# Patient Record
Sex: Male | Born: 1985 | Race: Black or African American | Hispanic: No | Marital: Single | Smoking: Current every day smoker
Health system: Southern US, Community
[De-identification: ages and names within clinical notes are randomized; demographics above are authoritative.]

---
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
## 2007-10-24 ENCOUNTER — Emergency Department (HOSPITAL_COMMUNITY): Admission: EM | Admit: 2007-10-24 | Discharge: 2007-10-24 | Payer: Self-pay | Admitting: Emergency Medicine

## 2009-04-20 ENCOUNTER — Emergency Department (HOSPITAL_COMMUNITY): Admission: EM | Admit: 2009-04-20 | Discharge: 2009-04-20 | Payer: Self-pay | Admitting: General Surgery

## 2009-04-22 ENCOUNTER — Emergency Department (HOSPITAL_COMMUNITY): Admission: EM | Admit: 2009-04-22 | Discharge: 2009-04-22 | Payer: Self-pay | Admitting: Family Medicine

## 2011-01-31 LAB — CBC
HCT: 43.4
Hemoglobin: 14.1
RBC: 5.46
RDW: 15.1
WBC: 18.7 — ABNORMAL HIGH

## 2011-01-31 LAB — COMPREHENSIVE METABOLIC PANEL
ALT: 34
Alkaline Phosphatase: 67
BUN: 21
CO2: 17 — ABNORMAL LOW
Chloride: 104
Glucose, Bld: 151 — ABNORMAL HIGH
Potassium: 5.1
Sodium: 138
Total Bilirubin: 1.4 — ABNORMAL HIGH
Total Protein: 7.5

## 2011-01-31 LAB — DIFFERENTIAL
Basophils Absolute: 0
Basophils Relative: 0
Eosinophils Absolute: 0
Monocytes Relative: 2 — ABNORMAL LOW
Neutro Abs: 17.2 — ABNORMAL HIGH
Neutrophils Relative %: 92 — ABNORMAL HIGH

## 2011-01-31 LAB — URINALYSIS, ROUTINE W REFLEX MICROSCOPIC
Glucose, UA: NEGATIVE
Hgb urine dipstick: NEGATIVE
Ketones, ur: >80 — AB
Protein, ur: NEGATIVE
Urobilinogen, UA: 0.2

## 2015-09-06 ENCOUNTER — Emergency Department (HOSPITAL_COMMUNITY)
Admission: EM | Admit: 2015-09-06 | Discharge: 2015-09-06 | Disposition: A | Discharge status: Home / Self Care | Payer: Self-pay | Attending: Emergency Medicine | Admitting: Emergency Medicine

## 2015-09-06 ENCOUNTER — Encounter (HOSPITAL_COMMUNITY): Payer: Self-pay | Admitting: Emergency Medicine

## 2015-09-06 DIAGNOSIS — F1210 Cannabis abuse, uncomplicated: Secondary | ICD-10-CM | POA: Insufficient documentation

## 2015-09-06 DIAGNOSIS — K029 Dental caries, unspecified: Secondary | ICD-10-CM

## 2015-09-06 DIAGNOSIS — F17200 Nicotine dependence, unspecified, uncomplicated: Secondary | ICD-10-CM | POA: Insufficient documentation

## 2015-09-06 MED ORDER — PENICILLIN V POTASSIUM 500 MG PO TABS: 500.0000 mg | ORAL_TABLET | Freq: Three times a day (TID) | ORAL | Status: AC

## 2015-09-06 MED ORDER — HYDROCODONE-ACETAMINOPHEN 5-325 MG PO TABS: 1.0000 | ORAL_TABLET | ORAL | Status: DC | PRN

## 2015-09-06 NOTE — ED Provider Notes (Signed)
CSN: [id number]     Arrival date & time 09/06/15  [id number] History   First MD Initiated Contact with Patient 09/06/15 [phone]     Chief Complaint  Patient presents with  . Dental Pain     (Consider location/radiation/quality/duration/timing/severity/associated sxs/prior Treatment) Patient is a 30 y.o. male presenting with tooth pain. The history is provided by the patient.  Dental Pain Location:  Upper Upper teeth location:  2/RU 2nd molar Quality:  Throbbing Severity:  Moderate Onset quality:  Sudden Duration:  2 days Timing:  Constant Progression:  Worsening Chronicity:  New Relieved by:  Nothing Worsened by:  Nothing tried   History reviewed. No pertinent past medical history. History reviewed. No pertinent past surgical history. Family History  Problem Relation Age of Onset  . Diabetes Other    Social History  Substance Use Topics  . Smoking status: Current Every Day Smoker  . Smokeless tobacco: None  . Alcohol Use: No    Review of Systems  All other systems reviewed and are negative.     Allergies  Review of patient's allergies indicates no known allergies.  Home Medications   Prior to Admission medications   Not on File   BP 132/89 mmHg  Pulse 60  Temp(Src) 98 F (36.7 C) (Oral)  Resp 18  SpO2 99% Physical Exam  Constitutional: He is oriented to person, place, and time. He appears well-developed and well-nourished. No distress.  HENT:  Head: Normocephalic and atraumatic.  The right upper second molar is noted to have significant decay.  There is some gingival inflammation but no purulent drainage or abscess.  Neck: Normal range of motion. Neck supple.  Musculoskeletal: Normal range of motion.  Neurological: He is alert and oriented to person, place, and time.  Skin: Skin is warm. He is not diaphoretic.  Nursing note and vitals reviewed.   ED Course  Procedures (including critical care time) Labs Review Labs Reviewed - No data to  display  Imaging Review No results found. I have personally reviewed and evaluated these images and lab results as part of my medical decision-making.   EKG Interpretation None      MDM   Final diagnoses:  None    Will treat with penicillin, pain meds, and follow-up with dentistry.    Geoffery Lyonsouglas Joleen Stuckert, MD 09/06/15 [phone]

## 2015-09-06 NOTE — ED Notes (Signed)
Pt is c/o toothache on the right top  Pt states sxs started on Monday

## 2015-09-06 NOTE — Discharge Instructions (Signed)
Penicillin as prescribed.  Hydrocodone as prescribed as needed for pain.  Follow-up with dentistry in the next 2-3 days.   Dental Caries Dental caries (also called tooth decay) is the most common oral disease. It can occur at any age but is more common in children and young adults.  HOW DENTAL CARIES DEVELOPS  The process of decay begins when bacteria and foods (particularly sugars and starches) combine in your mouth to produce plaque. Plaque is a substance that sticks to the hard, outer surface of a tooth (enamel). The bacteria in plaque produce acids that attack enamel. These acids may also attack the root surface of a tooth (cementum) if it is exposed. Repeated attacks dissolve these surfaces and create holes in the tooth (cavities). If left untreated, the acids destroy the other layers of the tooth.  RISK FACTORS  Frequent sipping of sugary beverages.   Frequent snacking on sugary and starchy foods, especially those that easily get stuck in the teeth.   Poor oral hygiene.   Dry mouth.   Substance abuse such as methamphetamine abuse.   Broken or poor-fitting dental restorations.   Eating disorders.   Gastroesophageal reflux disease (GERD).   Certain radiation treatments to the head and neck. SYMPTOMS In the early stages of dental caries, symptoms are seldom present. Sometimes white, chalky areas may be seen on the enamel or other tooth layers. In later stages, symptoms may include:  Pits and holes on the enamel.  Toothache after sweet, hot, or cold foods or drinks are consumed.  Pain around the tooth.  Swelling around the tooth. DIAGNOSIS  Most of the time, dental caries is detected during a regular dental checkup. A diagnosis is made after a thorough medical and dental history is taken and the surfaces of your teeth are checked for signs of dental caries. Sometimes special instruments, such as lasers, are used to check for dental caries. Dental X-ray exams may be  taken so that areas not visible to the eye (such as between the contact areas of the teeth) can be checked for cavities.  TREATMENT  If dental caries is in its early stages, it may be reversed with a fluoride treatment or an application of a remineralizing agent at the dental office. Thorough brushing and flossing at home is needed to aid these treatments. If it is in its later stages, treatment depends on the location and extent of tooth destruction:   If a small area of the tooth has been destroyed, the destroyed area will be removed and cavities will be filled with a material such as gold, silver amalgam, or composite resin.   If a large area of the tooth has been destroyed, the destroyed area will be removed and a cap (crown) will be fitted over the remaining tooth structure.   If the center part of the tooth (pulp) is affected, a procedure called a root canal will be needed before a filling or crown can be placed.   If most of the tooth has been destroyed, the tooth may need to be pulled (extracted). HOME CARE INSTRUCTIONS You can prevent, stop, or reverse dental caries at home by practicing good oral hygiene. Good oral hygiene includes:  Thoroughly cleaning your teeth at least twice a day with a toothbrush and dental floss.   Using a fluoride toothpaste. A fluoride mouth rinse may also be used if recommended by your dentist or health care provider.   Restricting the amount of sugary and starchy foods and sugary liquids  you consume.   Avoiding frequent snacking on these foods and sipping of these liquids.   Keeping regular visits with a dentist for checkups and cleanings. PREVENTION   Practice good oral hygiene.  Consider a dental sealant. A dental sealant is a coating material that is applied by your dentist to the pits and grooves of teeth. The sealant prevents food from being trapped in them. It may protect the teeth for several years.  Ask about fluoride supplements if  you live in a community without fluorinated water or with water that has a low fluoride content. Use fluoride supplements as directed by your dentist or health care provider.  Allow fluoride varnish applications to teeth if directed by your dentist or health care provider.   This information is not intended to replace advice given to you by your health care provider. Make sure you discuss any questions you have with your health care provider.   Document Released: 01/12/2002 Document Revised: 05/13/2014 Document Reviewed: 04/24/2012 Elsevier Interactive Patient Education 2016 [employer] Resource Guide Dental The United [employer] 211 is a great source of information about community services available.  Access by dialing 2-1-1 from anywhere in [place], or by website -  PooledIncome.plwww.nc211.org.   Other Local Resources (Updated 05/2015)  Dental  Care   Services    Phone Number and Address  Cost  [facility] For children 190 - 28 years of age:   Cleaning  Tooth brushing/flossing instruction  Sealants, fillings, crowns  Extractions  Emergency treatment  [phone] [address] [place], [place] [id number] Charges based on family income.  Medicaid and some insurance plans accepted.     [facility]  Cleaning  Sealants, fillings, crowns  Extractions  Emergency treatment [phone] [address] [place], [place]  Pregnant women 28 years of age or older with a Medicaid card  [facility] [place]  Cleaning  Sealants, fillings, crowns  Extractions  Emergency treatment [phone] [address] [place], [place] Pregnant women 28 years of age or older with a Medicaid card  [facility] For children 80 - 28 years of age:   Cleaning  Tooth brushing/flossing instruction  Sealants, fillings,  crowns  Extractions  Emergency treatment Limited orthodontic services for patients with Medicaid [phone] [address] [place], [place] [id number] Medicaid and [facility] Health Choice cover for children up to age 28 and pregnant women.  Parents of children up to age 28 without Medicaid pay a reduced fee at time of service.  [facility] [place] For children 490 - 28 years of age:   Cleaning  Tooth brushing/flossing instruction  Sealants, fillings, crowns  Extractions  Emergency treatment Limited orthodontic services for patients with Medicaid [id number] [address] [place], [place].  Medicaid and [facility] Health Choice cover for children up to age 28 and pregnant women.  Parents of children up to age 28 without Medicaid pay a reduced fee.  [facility]  Cleaning  Sealants, fillings, crowns  Extractions  Hours: Tuesdays and Thursdays, 4:15 - 8 pm [phone] [address] [place], [place] [id number] Services free of charge to [facility] residents ages 18-64 who do not have health insurance, Medicare, [place], or [place] benefits and fall within federal poverty guidelines  [facility]    Provides  dental care in addition to primary medical care, nutritional counseling, and pharmacy:  Cleaning  Sealants, fillings, crowns  Extractions                  [phone] [facility], [address] [place], [place]  [phone] [facility], [address] [place], [place]  [phone] [facility] [place], [place]  [phone] [facility], [address] [place], [place]  [phone] [facility] [address] [place], [place] Accepts [place], [place], most insurance.  Also provides services available to all with fees adjusted based on ability  to pay.    [facility]  Cleaning  Tooth brushing/flossing instruction  Sealants, fillings, crowns  Extractions  Emergency treatment Hours: Tuesdays, Thursdays, and Fridays from 8 am to 5 pm by appointment only. [phone] [address] [place], [place] [address] [facility] residents with Medicaid (depending on eligibility) and children with [facility] Health Choice - call for more information.  [facility]  Extractions only  Hours: 2nd and 4th Thursday of each month from 6:30 am - 9 am.   [phone] [address] [place], [place] [address] Ages 14 and older only.  Patients are seen on a first come, first served basis.  [employer]  [employer]  Extractions  Orthodontics  Endodontics  Implants/Crowns/Bridges  Complete and partial dentures [phone] [place], [place] Patients must complete an application for services.  There is often a waiting list.

## 2016-12-29 ENCOUNTER — Emergency Department (HOSPITAL_COMMUNITY)
Admission: EM | Admit: 2016-12-29 | Discharge: 2016-12-29 | Disposition: A | Discharge status: Home / Self Care | Payer: Self-pay | Attending: Emergency Medicine | Admitting: Emergency Medicine

## 2016-12-29 ENCOUNTER — Encounter (HOSPITAL_COMMUNITY): Payer: Self-pay | Admitting: Emergency Medicine

## 2016-12-29 DIAGNOSIS — R509 Fever, unspecified: Secondary | ICD-10-CM | POA: Insufficient documentation

## 2016-12-29 DIAGNOSIS — R61 Generalized hyperhidrosis: Secondary | ICD-10-CM | POA: Insufficient documentation

## 2016-12-29 DIAGNOSIS — F17200 Nicotine dependence, unspecified, uncomplicated: Secondary | ICD-10-CM | POA: Insufficient documentation

## 2016-12-29 DIAGNOSIS — R5383 Other fatigue: Secondary | ICD-10-CM | POA: Insufficient documentation

## 2016-12-29 DIAGNOSIS — R1310 Dysphagia, unspecified: Secondary | ICD-10-CM | POA: Insufficient documentation

## 2016-12-29 DIAGNOSIS — R112 Nausea with vomiting, unspecified: Secondary | ICD-10-CM | POA: Insufficient documentation

## 2016-12-29 DIAGNOSIS — K0889 Other specified disorders of teeth and supporting structures: Secondary | ICD-10-CM | POA: Insufficient documentation

## 2016-12-29 LAB — COMPREHENSIVE METABOLIC PANEL
ALBUMIN: 4.2 g/dL (ref 3.5–5.0)
ALT: 12 U/L — ABNORMAL LOW (ref 17–63)
ANION GAP: 11 (ref 5–15)
AST: 21 U/L (ref 15–41)
Alkaline Phosphatase: 39 U/L (ref 38–126)
BUN: 15 mg/dL (ref 6–20)
CHLORIDE: 103 mmol/L (ref 101–111)
CO2: 24 mmol/L (ref 22–32)
Calcium: 9.3 mg/dL (ref 8.9–10.3)
Creatinine, Ser: 1.74 mg/dL — ABNORMAL HIGH (ref 0.61–1.24)
GFR calc Af Amer: 60 mL/min — ABNORMAL LOW (ref >60)
GFR calc non Af Amer: 51 mL/min — ABNORMAL LOW (ref >60)
GLUCOSE: 131 mg/dL — AB (ref 65–99)
POTASSIUM: 3.9 mmol/L (ref 3.5–5.1)
SODIUM: 138 mmol/L (ref 135–145)
Total Bilirubin: 0.7 mg/dL (ref 0.3–1.2)
Total Protein: 7.1 g/dL (ref 6.5–8.1)

## 2016-12-29 LAB — CBC WITH DIFFERENTIAL/PLATELET
BASOS PCT: 0 %
Basophils Absolute: 0 10*3/uL (ref 0.0–0.1)
EOS ABS: 0 10*3/uL (ref 0.0–0.7)
Eosinophils Relative: 0 %
HCT: 38.9 % — ABNORMAL LOW (ref 39.0–52.0)
HEMOGLOBIN: 12.8 g/dL — AB (ref 13.0–17.0)
Lymphocytes Relative: 17 %
Lymphs Abs: 1.4 10*3/uL (ref 0.7–4.0)
MCH: 25.3 pg — ABNORMAL LOW (ref 26.0–34.0)
MCHC: 32.9 g/dL (ref 30.0–36.0)
MCV: 76.9 fL — ABNORMAL LOW (ref 78.0–100.0)
MONO ABS: 0.6 10*3/uL (ref 0.1–1.0)
MONOS PCT: 8 %
NEUTROS PCT: 75 %
Neutro Abs: 6 10*3/uL (ref 1.7–7.7)
PLATELETS: 140 10*3/uL — AB (ref 150–400)
RBC: 5.06 MIL/uL (ref 4.22–5.81)
RDW: 15 % (ref 11.5–15.5)
WBC: 8 10*3/uL (ref 4.0–10.5)

## 2016-12-29 LAB — LIPASE, BLOOD: Lipase: 22 U/L (ref 11–51)

## 2016-12-29 LAB — I-STAT CG4 LACTIC ACID, ED: Lactic Acid, Venous: 0.91 mmol/L (ref 0.5–1.9)

## 2016-12-29 MED ORDER — ONDANSETRON 4 MG PO TBDP
ORAL_TABLET | ORAL | Status: AC
  Filled 2016-12-29: qty 1

## 2016-12-29 MED ORDER — AMOXICILLIN 500 MG PO CAPS
1000.0000 mg | ORAL_CAPSULE | Freq: Once | ORAL | Status: AC
  Administered 2016-12-29: 1000 mg via ORAL
  Filled 2016-12-29: qty 2

## 2016-12-29 MED ORDER — SODIUM CHLORIDE 0.9 % IV BOLUS (SEPSIS)
1000.0000 mL | Freq: Once | INTRAVENOUS | Status: AC
  Administered 2016-12-29: 1000 mL via INTRAVENOUS

## 2016-12-29 MED ORDER — ONDANSETRON HCL 4 MG/2ML IJ SOLN
4.0000 mg | Freq: Once | INTRAMUSCULAR | Status: AC
  Administered 2016-12-29: 4 mg via INTRAVENOUS
  Filled 2016-12-29: qty 2

## 2016-12-29 MED ORDER — HYDROCODONE-ACETAMINOPHEN 5-325 MG PO TABS
ORAL_TABLET | ORAL | 0 refills | Status: AC
Start: 2016-12-29 — End: ?

## 2016-12-29 MED ORDER — ONDANSETRON 4 MG PO TBDP
4.0000 mg | ORAL_TABLET | Freq: Once | ORAL | Status: AC
  Administered 2016-12-29: 4 mg via ORAL

## 2016-12-29 MED ORDER — MORPHINE SULFATE (PF) 4 MG/ML IV SOLN
4.0000 mg | Freq: Once | INTRAVENOUS | Status: AC
  Administered 2016-12-29: 4 mg via INTRAVENOUS
  Filled 2016-12-29: qty 1

## 2016-12-29 MED ORDER — PROMETHAZINE HCL 25 MG RE SUPP: 25.0000 mg | Freq: Four times a day (QID) | RECTAL | 0 refills | Status: AC | PRN

## 2016-12-29 MED ORDER — AMOXICILLIN 500 MG PO CAPS: 1000.0000 mg | ORAL_CAPSULE | Freq: Two times a day (BID) | ORAL | 0 refills | Status: AC

## 2016-12-29 NOTE — Discharge Instructions (Signed)
Take vicodin for breakthrough pain, do not drink alcohol, drive, care for children or do other critical tasks while taking vicodin.  Take percocet for breakthrough pain, do not drink alcohol, drive, care for children or do other critical tasks while taking percocet.  Return to the emergency room for fever, change in vision, redness to the face that rapidly spreads towards the eye, nausea or vomiting, difficulty swallowing or shortness of breath.  Followup with a dentist is very important for ongoing evaluation and management of recurrent dental pain. Return to emergency department for emergent changing or worsening symptoms.  Low-cost dental clinic: [facility]  at [phone]**   You may also call [phone]

## 2016-12-29 NOTE — ED Provider Notes (Signed)
[facility] DEPT Provider Note   CSN: [id number] Arrival date & time: 12/29/16  0518     History   Chief Complaint Chief Complaint  Patient presents with  . Dental Pain    HPI   Blood pressure (!) 120/92, pulse (!) 49, temperature 98.2 F (36.8 C), temperature source Oral, resp. rate 18, height 5\' 9"  (1.753 m), weight 95.7 kg (211 lb), SpO2 100 %.  Jerry Bryant is a 31 y.o. male complaining of Left lower dental pain onset yesterday with swelling associated. His multiple episodes of nausea and vomiting this morning, he feels generally fatigued and is having difficulty swallowing. Reports tactile fever with no abdominal pain, change in bowel or bladder habits, lip or tongue swelling.  History reviewed. No pertinent past medical history.  There are no active problems to display for this patient.   History reviewed. No pertinent surgical history.     Home Medications    Prior to Admission medications   Medication Sig Start Date End Date Taking? Authorizing Provider  amoxicillin (AMOXIL) 500 MG capsule Take 2 capsules (1,000 mg total) by mouth 2 (two) times daily. 12/29/16   Reah Justo, Joni Reining, PA-C  HYDROcodone-acetaminophen (NORCO/VICODIN) 5-325 MG tablet Take 1-2 tablets by mouth every 6 hours as needed for pain. 12/29/16   Brigette Hopfer, Joni Reining, PA-C  penicillin v potassium (VEETID) 500 MG tablet Take 1 tablet (500 mg total) by mouth 3 (three) times daily. 09/06/15   Geoffery Lyons, MD  promethazine (PHENERGAN) 25 MG suppository Place 1 suppository (25 mg total) rectally every 6 (six) hours as needed for nausea or vomiting. 12/29/16   Shaunika Italiano, Joni Reining, PA-C    Family History Family History  Problem Relation Age of Onset  . Diabetes Other     Social History Social History  Substance Use Topics  . Smoking status: Current Every Day Smoker  . Smokeless tobacco: Never Used  . Alcohol use No     Allergies   Patient has no known allergies.   Review of Systems Review of  Systems  A complete review of systems was obtained and all systems are negative except as noted in the HPI and PMH.   Physical Exam Updated Vital Signs BP 109/67   Pulse (!) 50   Temp 98 F (36.7 C) (Oral)   Resp 18   Ht 5\' 9"  (1.753 m)   Wt 95.7 kg (211 lb)   SpO2 95%   BMI 31.16 kg/m   Physical Exam  Constitutional: He is oriented to person, place, and time. He appears well-developed and well-nourished. No distress.  Diaphoretic  HENT:  Head: Normocephalic and atraumatic.  Mouth/Throat: Oropharynx is clear and moist.  Generally poor dentition, no gingival swelling, erythema or tenderness to palpation. Patient is handling their secretions. There is no tenderness to palpation or firmness underneath tongue bilaterally. No trismus.    Eyes: Pupils are equal, round, and reactive to light. Conjunctivae and EOM are normal.  Neck: Normal range of motion.  Cardiovascular: Normal rate, regular rhythm and intact distal pulses.   Pulmonary/Chest: Effort normal and breath sounds normal. No stridor. No respiratory distress. He has no wheezes. He has no rales. He exhibits no tenderness.  Abdominal: Soft. He exhibits no distension and no mass. There is no tenderness. There is no rebound and no guarding. No hernia.  Musculoskeletal: Normal range of motion.  Lymphadenopathy:    He has no cervical adenopathy.  Neurological: He is alert and oriented to person, place, and time.  Skin: Capillary refill takes  less than 2 seconds. He is not diaphoretic.  Psychiatric: He has a normal mood and affect.  Nursing note and vitals reviewed.    ED Treatments / Results  Labs (all labs ordered are listed, but only abnormal results are displayed) Labs Reviewed  CBC WITH DIFFERENTIAL/PLATELET - Abnormal; Notable for the following:       Result Value   Hemoglobin 12.8 (*)    HCT 38.9 (*)    MCV 76.9 (*)    MCH 25.3 (*)    Platelets 140 (*)    All other components within normal limits  COMPREHENSIVE  METABOLIC PANEL - Abnormal; Notable for the following:    Glucose, Bld 131 (*)    Creatinine, Ser 1.74 (*)    ALT 12 (*)    GFR calc non Af Amer 51 (*)    GFR calc Af Amer 60 (*)    All other components within normal limits  LIPASE, BLOOD  I-STAT CG4 LACTIC ACID, ED    EKG  EKG Interpretation None       Radiology No results found.  Procedures Procedures (including critical care time)  Medications Ordered in ED Medications  ondansetron (ZOFRAN-ODT) disintegrating tablet 4 mg (4 mg Oral Given 12/29/16 0616)  sodium chloride 0.9 % bolus 1,000 mL (0 mLs Intravenous Stopped 12/29/16 0945)  morphine 4 MG/ML injection 4 mg (4 mg Intravenous Given 12/29/16 0754)  ondansetron (ZOFRAN) injection 4 mg (4 mg Intravenous Given 12/29/16 0754)  amoxicillin (AMOXIL) capsule 1,000 mg (1,000 mg Oral Given 12/29/16 0947)     Initial Impression / Assessment and Plan / ED Course  I have reviewed the triage vital signs and the nursing notes.  Pertinent labs & imaging results that were available during my care of the patient were reviewed by me and considered in my medical decision making (see chart for details).     Vitals:   12/29/16 0830 12/29/16 0900 12/29/16 0930 12/29/16 0945  BP: 110/68 107/67 109/67   Pulse: (!) 48 (!) 49 (!) 50   Resp:    18  Temp:    98 F (36.7 C)  TempSrc:    Oral  SpO2: 93% 95% 95%   Weight:      Height:        Medications  ondansetron (ZOFRAN-ODT) disintegrating tablet 4 mg (4 mg Oral Given 12/29/16 0616)  sodium chloride 0.9 % bolus 1,000 mL (0 mLs Intravenous Stopped 12/29/16 0945)  morphine 4 MG/ML injection 4 mg (4 mg Intravenous Given 12/29/16 0754)  ondansetron (ZOFRAN) injection 4 mg (4 mg Intravenous Given 12/29/16 0754)  amoxicillin (AMOXIL) capsule 1,000 mg (1,000 mg Oral Given 12/29/16 0947)    Jerry Bryant is 31 y.o. male presenting with Dental pain, no gross dental abscess. He is vomiting and appears diaphoretic on my exam, will check basic  blood work, hydrate.  Blood work reassuring. He is tolerating by mouth's and feels much better after hydration, repeat abdominal exam is benign. Patient will be started on amoxicillin for possible early abscess given pain medication and dental resource guide.  Evaluation does not show pathology that would require ongoing emergent intervention or inpatient treatment. Pt is hemodynamically stable and mentating appropriately. Discussed findings and plan with patient/guardian, who agrees with care plan. All questions answered. Return precautions discussed and outpatient follow up given.    Final Clinical Impressions(s) / ED Diagnoses   Final diagnoses:  Pain, dental  Nausea and vomiting, intractability of vomiting not specified, unspecified vomiting type  New Prescriptions Discharge Medication List as of 12/29/2016  9:30 AM    START taking these medications   Details  amoxicillin (AMOXIL) 500 MG capsule Take 2 capsules (1,000 mg total) by mouth 2 (two) times daily., Starting Sun 12/29/2016, Print    promethazine (PHENERGAN) 25 MG suppository Place 1 suppository (25 mg total) rectally every 6 (six) hours as needed for nausea or vomiting., Starting Sun 12/29/2016, Print         Arjen Deringer, [place], PA-C 12/29/16 1449    Tilden Fossa, MD 12/30/16 [phone]

## 2016-12-29 NOTE — ED Triage Notes (Signed)
Reports toothache on left lower side since yesterday.  Reports having a bad tooth that needs to be pulled.

## 2018-10-17 ENCOUNTER — Emergency Department (HOSPITAL_COMMUNITY): Payer: Self-pay

## 2018-10-17 ENCOUNTER — Encounter (HOSPITAL_COMMUNITY): Payer: Self-pay | Admitting: Radiology

## 2018-10-17 ENCOUNTER — Other Ambulatory Visit: Payer: Self-pay

## 2018-10-17 ENCOUNTER — Telehealth (HOSPITAL_COMMUNITY): Payer: Self-pay | Admitting: Student

## 2018-10-17 ENCOUNTER — Emergency Department (HOSPITAL_COMMUNITY)
Admission: EM | Admit: 2018-10-17 | Discharge: 2018-10-17 | Disposition: A | Discharge status: Home / Self Care | Payer: Self-pay | Attending: Emergency Medicine | Admitting: Emergency Medicine

## 2018-10-17 DIAGNOSIS — R079 Chest pain, unspecified: Secondary | ICD-10-CM | POA: Insufficient documentation

## 2018-10-17 DIAGNOSIS — R109 Unspecified abdominal pain: Secondary | ICD-10-CM | POA: Insufficient documentation

## 2018-10-17 DIAGNOSIS — Y999 Unspecified external cause status: Secondary | ICD-10-CM | POA: Insufficient documentation

## 2018-10-17 DIAGNOSIS — Y92410 Unspecified street and highway as the place of occurrence of the external cause: Secondary | ICD-10-CM | POA: Insufficient documentation

## 2018-10-17 DIAGNOSIS — S46911A Strain of unspecified muscle, fascia and tendon at shoulder and upper arm level, right arm, initial encounter: Secondary | ICD-10-CM | POA: Insufficient documentation

## 2018-10-17 DIAGNOSIS — R55 Syncope and collapse: Secondary | ICD-10-CM | POA: Insufficient documentation

## 2018-10-17 DIAGNOSIS — Y9389 Activity, other specified: Secondary | ICD-10-CM | POA: Insufficient documentation

## 2018-10-17 LAB — I-STAT CHEM 8, ED
BUN: 21 mg/dL — ABNORMAL HIGH (ref 6–20)
Calcium, Ion: 1.11 mmol/L — ABNORMAL LOW (ref 1.15–1.40)
Chloride: 106 mmol/L (ref 98–111)
Creatinine, Ser: 1.2 mg/dL (ref 0.61–1.24)
Glucose, Bld: 115 mg/dL — ABNORMAL HIGH (ref 70–99)
HCT: 46 % (ref 39.0–52.0)
Hemoglobin: 15.6 g/dL (ref 13.0–17.0)
Potassium: 4.1 mmol/L (ref 3.5–5.1)
Sodium: 138 mmol/L (ref 135–145)
TCO2: 26 mmol/L (ref 22–32)

## 2018-10-17 LAB — URINALYSIS, ROUTINE W REFLEX MICROSCOPIC
Bilirubin Urine: NEGATIVE
Glucose, UA: NEGATIVE mg/dL
Hgb urine dipstick: NEGATIVE
Ketones, ur: NEGATIVE mg/dL
Leukocytes,Ua: NEGATIVE
Nitrite: NEGATIVE
Protein, ur: NEGATIVE mg/dL
Specific Gravity, Urine: 1.03 (ref 1.005–1.030)
pH: 5 (ref 5.0–8.0)

## 2018-10-17 LAB — CBC
HCT: 43.6 % (ref 39.0–52.0)
Hemoglobin: 14.2 g/dL (ref 13.0–17.0)
MCH: 25.8 pg — ABNORMAL LOW (ref 26.0–34.0)
MCHC: 32.6 g/dL (ref 30.0–36.0)
MCV: 79.1 fL — ABNORMAL LOW (ref 80.0–100.0)
Platelets: 179 10*3/uL (ref 150–400)
RBC: 5.51 MIL/uL (ref 4.22–5.81)
RDW: 14.6 % (ref 11.5–15.5)
WBC: 6.3 10*3/uL (ref 4.0–10.5)
nRBC: 0 % (ref 0.0–0.2)

## 2018-10-17 LAB — COMPREHENSIVE METABOLIC PANEL
ALT: 14 U/L (ref 0–44)
AST: 20 U/L (ref 15–41)
Albumin: 4.5 g/dL (ref 3.5–5.0)
Alkaline Phosphatase: 47 U/L (ref 38–126)
Anion gap: 9 (ref 5–15)
BUN: 15 mg/dL (ref 6–20)
CO2: 21 mmol/L — ABNORMAL LOW (ref 22–32)
Calcium: 9.4 mg/dL (ref 8.9–10.3)
Chloride: 108 mmol/L (ref 98–111)
Creatinine, Ser: 1.21 mg/dL (ref 0.61–1.24)
GFR calc Af Amer: >60 mL/min (ref >60)
GFR calc non Af Amer: >60 mL/min (ref >60)
Glucose, Bld: 117 mg/dL — ABNORMAL HIGH (ref 70–99)
Potassium: 4 mmol/L (ref 3.5–5.1)
Sodium: 138 mmol/L (ref 135–145)
Total Bilirubin: 0.8 mg/dL (ref 0.3–1.2)
Total Protein: 7.5 g/dL (ref 6.5–8.1)

## 2018-10-17 LAB — SAMPLE TO BLOOD BANK

## 2018-10-17 LAB — ETHANOL: Alcohol, Ethyl (B): <10 mg/dL (ref <10)

## 2018-10-17 LAB — CDS SEROLOGY

## 2018-10-17 LAB — LACTIC ACID, PLASMA: Lactic Acid, Venous: 1.2 mmol/L (ref 0.5–1.9)

## 2018-10-17 LAB — PROTIME-INR
INR: 1 (ref 0.8–1.2)
Prothrombin Time: 12.9 seconds (ref 11.4–15.2)

## 2018-10-17 MED ORDER — HYDROCODONE-ACETAMINOPHEN 5-325 MG PO TABS: 1.0000 | ORAL_TABLET | Freq: Four times a day (QID) | ORAL | 0 refills | Status: AC | PRN

## 2018-10-17 MED ORDER — HYDROCODONE-ACETAMINOPHEN 5-325 MG PO TABS: 1.0000 | ORAL_TABLET | Freq: Four times a day (QID) | ORAL | 0 refills | Status: DC | PRN

## 2018-10-17 MED ORDER — IOHEXOL 300 MG/ML  SOLN
100.0000 mL | Freq: Once | INTRAMUSCULAR | Status: AC | PRN
  Administered 2018-10-17: 100 mL via INTRAVENOUS

## 2018-10-17 MED ORDER — IBUPROFEN 600 MG PO TABS: 600.0000 mg | ORAL_TABLET | Freq: Four times a day (QID) | ORAL | 0 refills | Status: AC | PRN

## 2018-10-17 MED ORDER — METHOCARBAMOL 500 MG PO TABS
1000.0000 mg | ORAL_TABLET | Freq: Once | ORAL | Status: AC
  Administered 2018-10-17: 12:00:00 1000 mg via ORAL
  Filled 2018-10-17: qty 2

## 2018-10-17 MED ORDER — METHOCARBAMOL 500 MG PO TABS: 1000.0000 mg | ORAL_TABLET | Freq: Three times a day (TID) | ORAL | 0 refills | Status: AC | PRN

## 2018-10-17 MED ORDER — FENTANYL CITRATE (PF) 100 MCG/2ML IJ SOLN
INTRAMUSCULAR | Status: AC
  Filled 2018-10-17: qty 2

## 2018-10-17 MED ORDER — IBUPROFEN 400 MG PO TABS
600.0000 mg | ORAL_TABLET | Freq: Once | ORAL | Status: AC
  Administered 2018-10-17: 600 mg via ORAL
  Filled 2018-10-17: qty 1

## 2018-10-17 MED ORDER — FENTANYL CITRATE (PF) 100 MCG/2ML IJ SOLN
50.0000 ug | Freq: Once | INTRAMUSCULAR | Status: AC
  Administered 2018-10-17: 50 ug via INTRAVENOUS

## 2018-10-17 MED ORDER — HYDROCODONE-ACETAMINOPHEN 5-325 MG PO TABS
1.0000 | ORAL_TABLET | Freq: Once | ORAL | Status: AC
  Administered 2018-10-17: 12:00:00 1 via ORAL
  Filled 2018-10-17: qty 1

## 2018-10-17 NOTE — ED Notes (Signed)
gpd at bedside 

## 2018-10-17 NOTE — ED Notes (Signed)
C collar removed by Dr. Lita Mains

## 2018-10-17 NOTE — ED Notes (Signed)
Pt ambulatory to the restroom with steady gait.

## 2018-10-17 NOTE — ED Notes (Signed)
Spoke with patient's grandmother Cherene Julian, per patient's request. Unable to get in touch with patient's mother Tia. Patient's grandmother stated she would let patient's mother know he is here in the ED.

## 2018-10-17 NOTE — ED Notes (Signed)
Patient transported to CT 

## 2018-10-17 NOTE — ED Notes (Signed)
Ortho tech in room to apply sling to right arm.
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
## 2018-10-17 NOTE — ED Notes (Signed)
Patient's mother called and spoke with patient as well as this Therapist, sports. Patient's mother was given update on plan of care for patient per his request.

## 2018-10-18 ENCOUNTER — Encounter (HOSPITAL_COMMUNITY): Payer: Self-pay | Admitting: Radiology

## 2018-10-18 ENCOUNTER — Emergency Department (HOSPITAL_COMMUNITY)
Admission: EM | Admit: 2018-10-18 | Discharge: 2018-10-18 | Disposition: A | Discharge status: Home / Self Care | Payer: Self-pay | Attending: Emergency Medicine | Admitting: Emergency Medicine

## 2018-10-18 ENCOUNTER — Emergency Department (HOSPITAL_COMMUNITY): Payer: Self-pay

## 2018-10-18 DIAGNOSIS — S2223XA Sternal manubrial dissociation, initial encounter for closed fracture: Secondary | ICD-10-CM | POA: Insufficient documentation

## 2018-10-18 DIAGNOSIS — F17200 Nicotine dependence, unspecified, uncomplicated: Secondary | ICD-10-CM | POA: Insufficient documentation

## 2018-10-18 DIAGNOSIS — Y999 Unspecified external cause status: Secondary | ICD-10-CM | POA: Insufficient documentation

## 2018-10-18 DIAGNOSIS — Y939 Activity, unspecified: Secondary | ICD-10-CM | POA: Insufficient documentation

## 2018-10-18 DIAGNOSIS — Y929 Unspecified place or not applicable: Secondary | ICD-10-CM | POA: Insufficient documentation

## 2018-10-18 LAB — BASIC METABOLIC PANEL
Anion gap: 8 (ref 5–15)
BUN: 11 mg/dL (ref 6–20)
CO2: 22 mmol/L (ref 22–32)
Calcium: 9.5 mg/dL (ref 8.9–10.3)
Chloride: 107 mmol/L (ref 98–111)
Creatinine, Ser: 1.36 mg/dL — ABNORMAL HIGH (ref 0.61–1.24)
GFR calc Af Amer: >60 mL/min (ref >60)
GFR calc non Af Amer: >60 mL/min (ref >60)
Glucose, Bld: 107 mg/dL — ABNORMAL HIGH (ref 70–99)
Potassium: 4 mmol/L (ref 3.5–5.1)
Sodium: 137 mmol/L (ref 135–145)

## 2018-10-18 LAB — CBC
HCT: 41.5 % (ref 39.0–52.0)
Hemoglobin: 13.5 g/dL (ref 13.0–17.0)
MCH: 25.9 pg — ABNORMAL LOW (ref 26.0–34.0)
MCHC: 32.5 g/dL (ref 30.0–36.0)
MCV: 79.5 fL — ABNORMAL LOW (ref 80.0–100.0)
Platelets: 166 10*3/uL (ref 150–400)
RBC: 5.22 MIL/uL (ref 4.22–5.81)
RDW: 14.9 % (ref 11.5–15.5)
WBC: 5.7 10*3/uL (ref 4.0–10.5)
nRBC: 0 % (ref 0.0–0.2)

## 2018-10-18 MED ORDER — MORPHINE SULFATE (PF) 4 MG/ML IV SOLN
4.0000 mg | Freq: Once | INTRAVENOUS | Status: AC
  Administered 2018-10-18: 13:00:00 4 mg via INTRAVENOUS
  Filled 2018-10-18: qty 1

## 2018-10-18 MED ORDER — OXYCODONE-ACETAMINOPHEN 5-325 MG PO TABS
1.0000 | ORAL_TABLET | Freq: Once | ORAL | Status: AC
  Administered 2018-10-18: 1 via ORAL
  Filled 2018-10-18: qty 1

## 2018-10-18 MED ORDER — IOHEXOL 300 MG/ML  SOLN
75.0000 mL | Freq: Once | INTRAMUSCULAR | Status: AC | PRN
  Administered 2018-10-18: 16:00:00 75 mL via INTRAVENOUS

## 2018-10-18 NOTE — ED Notes (Signed)
Called to check the status of BMP, main lab unable to locate at this time
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
## 2018-10-18 NOTE — ED Notes (Signed)
Patient transported to CT 

## 2018-10-18 NOTE — Discharge Instructions (Addendum)
Please call the cardiothoracic surgeon for follow-up  Please take the pain medication as prescribed  Return to the emergency department as needed for new or worsening symptoms  It would likely take 4 to 6 weeks for the sternal fracture to heal completely

## 2018-10-18 NOTE — ED Notes (Signed)
Pt returned from CT °
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
## 2018-10-19 ENCOUNTER — Encounter (HOSPITAL_COMMUNITY): Payer: Self-pay | Admitting: Radiology

## 2018-10-22 ENCOUNTER — Other Ambulatory Visit: Payer: Self-pay | Admitting: *Deleted

## 2018-12-02 ENCOUNTER — Other Ambulatory Visit: Payer: Self-pay | Admitting: Cardiothoracic Surgery

## 2018-12-02 DIAGNOSIS — S2223XD Sternal manubrial dissociation, subsequent encounter for fracture with routine healing: Secondary | ICD-10-CM

## 2018-12-03 ENCOUNTER — Encounter: Payer: Self-pay | Admitting: Cardiothoracic Surgery

## 2018-12-03 NOTE — Progress Notes (Deleted)
[address]       [place] [id number]             [phone]                    J[facility] Medical Record #[id number] Date of Birth: [birth year]  Referring: Azalia Bilisampos, Kevin, MD Primary Care: Patient, No Pcp Per Primary Cardiologist: No primary care provider on file.  Chief Complaint:   No chief complaint on file.   History of Present Illness:    Jerry RamQuentin Quesenberry 33 y.o. male is seen in the office  today for       Current Activity/ Functional Status:  {functional status:19517}   Zubrod Score: At the time of surgery this patient's most appropriate activity status/level should be described as: []     0    Normal activity, no symptoms []     1    Restricted in physical strenuous activity but ambulatory, able to do out light work []     2    Ambulatory and capable of self care, unable to do work activities, up and about               >50 % of waking hours                              []     3    Only limited self care, in bed greater than 50% of waking hours []     4    Completely disabled, no self care, confined to bed or chair []     5    Moribund   No past medical history on file.  No past surgical history on file.  Family History  Problem Relation Age of Onset  . Diabetes Other      Social History   Tobacco Use  Smoking Status Current Every Day Smoker  Smokeless Tobacco Never Used    Social History   Substance and Sexual Activity  Alcohol Use No     No Known Allergies  Current Outpatient Medications  Medication Sig Dispense Refill  . amoxicillin (AMOXIL) 500 MG capsule Take 2 capsules (1,000 mg total) by mouth 2 (two) times daily. (Patient not taking: Reported on 10/18/2018) 40 capsule 0  . HYDROcodone-acetaminophen (NORCO) 5-325 MG tablet Take 1 tablet by mouth every 6 (six) hours as needed. 10 tablet 0  . HYDROcodone-acetaminophen (NORCO/VICODIN) 5-325 MG tablet Take 1-2 tablets by mouth every 6 hours as needed for pain.  (Patient not taking: Reported on 10/18/2018) 15 tablet 0  . ibuprofen (ADVIL) 200 MG tablet Take 400 mg by mouth every 6 (six) hours as needed.    Marland Kitchen. ibuprofen (ADVIL) 600 MG tablet Take 1 tablet (600 mg total) by mouth every 6 (six) hours as needed. 30 tablet 0  . methocarbamol (ROBAXIN) 500 MG tablet Take 2 tablets (1,000 mg total) by mouth every 8 (eight) hours as needed for muscle spasms. 30 tablet 0  . penicillin v potassium (VEETID) 500 MG tablet Take 1 tablet (500 mg total) by mouth 3 (three) times daily. (Patient not taking: Reported on 10/18/2018) 30 tablet 0  . promethazine (PHENERGAN) 25 MG suppository Place 1 suppository (25 mg total) rectally every 6 (six) hours as needed for nausea or vomiting. (Patient not taking: Reported on 10/18/2018) 12 each 0   No current facility-administered medications for this visit.     {  Ros - complete:30496}   Review of Systems:     Cardiac Review of Systems: [Y] = yes  or   [ N ] = no   Chest Pain [    ]  Resting SOB [   ] Exertional SOB  [  ]  Orthopnea [  ]   Pedal Edema [   ]    Palpitations [  ] Syncope  [  ]   Presyncope [   ]   General Review of Systems: [Y] = yes [  ]=no Constitional: recent weight change [  ];  Wt loss over the last 3 months [   ] anorexia [  ]; fatigue [  ]; nausea [  ]; night sweats [  ]; fever [  ]; or chills [  ];           Eye : blurred vision [  ]; diplopia [   ]; vision changes [  ];  Amaurosis fugax[  ]; Resp: cough [  ];  wheezing[  ];  hemoptysis[  ]; shortness of breath[  ]; paroxysmal nocturnal dyspnea[  ]; dyspnea on exertion[  ]; or orthopnea[  ];  GI:  gallstones[  ], vomiting[  ];  dysphagia[  ]; melena[  ];  hematochezia [  ]; heartburn[  ];   Hx of  Colonoscopy[  ]; GU: kidney stones [  ]; hematuria[  ];   dysuria [  ];  nocturia[  ];  history of     obstruction [  ]; urinary frequency [  ]             Skin: rash, swelling[  ];, hair loss[  ];  peripheral edema[  ];  or itching[  ]; Musculosketetal: myalgias[   ];  joint swelling[  ];  joint erythema[  ];  joint pain[  ];  back pain[  ];  Heme/Lymph: bruising[  ];  bleeding[  ];  anemia[  ];  Neuro: TIA[  ];  headaches[  ];  stroke[  ];  vertigo[  ];  seizures[  ];   paresthesias[  ];  difficulty walking[  ];  Psych:depression[  ]; anxiety[  ];  Endocrine: diabetes[  ];  thyroid dysfunction[  ];  Immunizations: Flu up to date [  ]; Pneumococcal up to date [  ];  Other:     PHYSICAL EXAMINATION: There were no vitals taken for this visit. {physical exam:21449}  Diagnostic Studies & Laboratory data:     Recent Radiology Findings:   No results found.   I have independently reviewed the above radiology studies  and reviewed the findings with the patient.   Recent Lab Findings: Lab Results  Component Value Date   WBC 5.7 10/18/2018   HGB 13.5 10/18/2018   HCT 41.5 10/18/2018   PLT 166 10/18/2018   GLUCOSE 107 (H) 10/18/2018   ALT 14 10/17/2018   AST 20 10/17/2018   NA 137 10/18/2018   K 4.0 10/18/2018   CL 107 10/18/2018   CREATININE 1.36 (H) 10/18/2018   BUN 11 10/18/2018   CO2 22 10/18/2018   INR 1.0 10/17/2018      Assessment / Plan:        I  spent {CHL ONC TIME VISIT - QIONG:[id number]}SWIFT:[id number]} with  the patient face to face and greater then 50% of the time was spent in counseling and coordination of care.    Delight OvensEdward B Vita Currin MD      [address] [place] [address] Office [phone]   Beeper [phone]  12/03/2018 9:54 AM
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
